# Patient Record
Sex: Female | Born: 1970 | Race: White | Hispanic: No | Marital: Married | Smoking: Former smoker
Health system: Southern US, Community
[De-identification: ages and names within clinical notes are randomized; demographics above are authoritative.]

---

## 2011-11-09 ENCOUNTER — Emergency Department (HOSPITAL_COMMUNITY)
Admission: EM | Admit: 2011-11-09 | Discharge: 2011-11-09 | Disposition: A | Discharge status: Home / Self Care | Payer: Managed Care, Other (non HMO) | Source: Home / Self Care | Attending: Family Medicine | Admitting: Family Medicine

## 2011-11-09 ENCOUNTER — Encounter (HOSPITAL_COMMUNITY): Payer: Self-pay | Admitting: *Deleted

## 2011-11-09 DIAGNOSIS — J40 Bronchitis, not specified as acute or chronic: Secondary | ICD-10-CM

## 2011-11-09 MED ORDER — ALBUTEROL SULFATE HFA 108 (90 BASE) MCG/ACT IN AERS: 1.0000 | INHALATION_SPRAY | Freq: Four times a day (QID) | RESPIRATORY_TRACT | Status: DC | PRN

## 2011-11-09 MED ORDER — AZITHROMYCIN 250 MG PO TABS: 250.0000 mg | ORAL_TABLET | Freq: Every day | ORAL | Status: AC

## 2011-11-09 MED ORDER — PHENYLEPHRINE-CHLORPHEN-DM 3.5-1-3 MG/ML PO LIQD: 1.0000 mL | Freq: Four times a day (QID) | ORAL | Status: AC | PRN

## 2011-11-09 MED ORDER — PREDNISONE 20 MG PO TABS: ORAL_TABLET | ORAL | Status: AC

## 2011-11-09 NOTE — ED Notes (Signed)
Pt  Reports  Symptoms  Of  Cough  /  Congested        X    1  Week  Has  Had  Symptoms    Of      Runny  Nose  As  Well  -  Pt  States  Has  A  Rash under l  Arm  Which  Has  Been  There        For a  Few  Days           Pt      Is  Sitting  Upright  On exam table      Speaking in  Complete  sentances

## 2011-11-09 NOTE — ED Provider Notes (Signed)
History     CSN: [id number]  Arrival date & time 11/09/11  1706   First MD Initiated Contact with Patient 11/09/11 1714      Chief Complaint  Patient presents with  . Cough    (Consider location/radiation/quality/duration/timing/severity/associated sxs/prior treatment) HPI Comments: 41 year old smoker female otherwise healthy. Here complaining of runny nose, persistent cough with productive yellow sputum for the last week. Symptoms worse at nighttime sometimes associated with wheezing. Denies fever, chills or chest pain. Appetite is good at baseline. Energy level at baseline. No abdominal pain or headache.    History reviewed. No pertinent past medical history.  History reviewed. No pertinent past surgical history.  No family history on file.  History  Substance Use Topics  . Smoking status: Current Everyday Smoker  . Smokeless tobacco: Not on file  . Alcohol Use: Yes    OB History    Grav Para Term Preterm Abortions TAB SAB Ect Mult Living                  Review of Systems  Constitutional: Negative for fever, chills and appetite change.  HENT: Positive for congestion and rhinorrhea. Negative for sore throat and neck pain.   Eyes: Negative for discharge.  Respiratory: Positive for cough, shortness of breath and wheezing.   Cardiovascular: Negative for chest pain, palpitations and leg swelling.  Gastrointestinal: Negative for nausea, vomiting, abdominal pain and diarrhea.  Musculoskeletal: Negative for myalgias and arthralgias.  Skin: Negative for rash.  Neurological: Negative for dizziness and headaches.    Allergies  Sulfa antibiotics  Home Medications   Current Outpatient Rx  Name Route Sig Dispense Refill  . XANAX PO Oral Take by mouth.    Marland Kitchen NADOLOL PO Oral Take by mouth.    . ALBUTEROL SULFATE HFA 108 (90 BASE) MCG/ACT IN AERS Inhalation Inhale 1-2 puffs into the lungs every 6 (six) hours as needed for wheezing. 1 Inhaler 0  . AZITHROMYCIN 250 MG PO TABS  Oral Take 1 tablet (250 mg total) by mouth daily. 6 tablet 0  . PHENYLEPHRINE-CHLORPHEN-DM 3.5-1-3 MG/ML PO LIQD Oral Take 1 mL by mouth every 6 (six) hours as needed for cough. 120 mL 0  . PREDNISONE 20 MG PO TABS  2 tabs po daily for 5 days 10 tablet 0    BP 117/66  Pulse 90  Temp 97.8 F (36.6 C) (Oral)  Resp 16  SpO2 100%  LMP 10/25/2011  Physical Exam  Nursing note and vitals reviewed. Constitutional: She is oriented to person, place, and time. She appears well-developed and well-nourished. No distress.  HENT:  Head: Normocephalic and atraumatic.  Right Ear: External ear normal.  Left Ear: External ear normal.  Nose: Nose normal.  Mouth/Throat: Oropharynx is clear and moist. No oropharyngeal exudate.  Eyes: Conjunctivae and EOM are normal. Pupils are equal, round, and reactive to light. Right eye exhibits no discharge. Left eye exhibits no discharge.  Neck: Neck supple. No JVD present. No thyromegaly present.  Cardiovascular: Normal rate, regular rhythm and normal heart sounds.  Exam reveals no gallop and no friction rub.   No murmur heard. Pulmonary/Chest: Effort normal.       Bronchitic cough. No tachypnea or orthopnea. Scattered rhonchi bilaterally. No active wheezing.  Lymphadenopathy:    She has no cervical adenopathy.  Neurological: She is alert and oriented to person, place, and time.  Skin: No rash noted.    ED Course  Procedures (including critical care time)  Labs Reviewed - No  data to display No results found.   1. Bronchitis       MDM  Encouraged to quit smoking. Treated with prednisone, albuterol, azithromycin and chlorpheniramine phenylephrine and dextromethorphan elixir. Asked to return if new onset of fever or worsening symptoms despite following treatment.        Sharin Grave, MD 11/10/11 [phone]

## 2017-11-20 ENCOUNTER — Encounter (HOSPITAL_COMMUNITY): Payer: Self-pay | Admitting: Emergency Medicine

## 2017-11-20 ENCOUNTER — Emergency Department (HOSPITAL_COMMUNITY)
Admission: EM | Admit: 2017-11-20 | Discharge: 2017-11-20 | Disposition: A | Discharge status: Home / Self Care | Payer: Self-pay | Attending: Emergency Medicine | Admitting: Emergency Medicine

## 2017-11-20 ENCOUNTER — Emergency Department (HOSPITAL_COMMUNITY): Payer: Self-pay

## 2017-11-20 DIAGNOSIS — Z79899 Other long term (current) drug therapy: Secondary | ICD-10-CM | POA: Insufficient documentation

## 2017-11-20 DIAGNOSIS — Z87891 Personal history of nicotine dependence: Secondary | ICD-10-CM | POA: Insufficient documentation

## 2017-11-20 DIAGNOSIS — R05 Cough: Secondary | ICD-10-CM | POA: Insufficient documentation

## 2017-11-20 DIAGNOSIS — R059 Cough, unspecified: Secondary | ICD-10-CM

## 2017-11-20 LAB — CBC WITH DIFFERENTIAL/PLATELET
ABS IMMATURE GRANULOCYTES: 0 10*3/uL (ref 0.0–0.1)
BASOS PCT: 1 %
Basophils Absolute: 0.1 10*3/uL (ref 0.0–0.1)
EOS PCT: 2 %
Eosinophils Absolute: 0.1 10*3/uL (ref 0.0–0.7)
HCT: 44.1 % (ref 36.0–46.0)
HEMOGLOBIN: 14.3 g/dL (ref 12.0–15.0)
Immature Granulocytes: 0 %
Lymphocytes Relative: 30 %
Lymphs Abs: 2.1 10*3/uL (ref 0.7–4.0)
MCH: 31 pg (ref 26.0–34.0)
MCHC: 32.4 g/dL (ref 30.0–36.0)
MCV: 95.5 fL (ref 78.0–100.0)
MONO ABS: 0.6 10*3/uL (ref 0.1–1.0)
MONOS PCT: 9 %
NEUTROS ABS: 4 10*3/uL (ref 1.7–7.7)
Neutrophils Relative %: 58 %
PLATELETS: 399 10*3/uL (ref 150–400)
RBC: 4.62 MIL/uL (ref 3.87–5.11)
RDW: 13.5 % (ref 11.5–15.5)
WBC: 6.9 10*3/uL (ref 4.0–10.5)

## 2017-11-20 LAB — BASIC METABOLIC PANEL
ANION GAP: 14 (ref 5–15)
BUN: 6 mg/dL (ref 6–20)
CALCIUM: 9.6 mg/dL (ref 8.9–10.3)
CO2: 23 mmol/L (ref 22–32)
Chloride: 103 mmol/L (ref 98–111)
Creatinine, Ser: 0.79 mg/dL (ref 0.44–1.00)
GFR calc Af Amer: >60 mL/min (ref >60)
Glucose, Bld: 111 mg/dL — ABNORMAL HIGH (ref 70–99)
Potassium: 4 mmol/L (ref 3.5–5.1)
Sodium: 140 mmol/L (ref 135–145)

## 2017-11-20 LAB — I-STAT BETA HCG BLOOD, ED (MC, WL, AP ONLY): I-stat hCG, quantitative: <5 m[IU]/mL (ref <5)

## 2017-11-20 MED ORDER — AZITHROMYCIN 250 MG PO TABS: 250.0000 mg | ORAL_TABLET | Freq: Every day | ORAL | 0 refills | Status: AC

## 2017-11-20 MED ORDER — ALBUTEROL SULFATE HFA 108 (90 BASE) MCG/ACT IN AERS
2.0000 | INHALATION_SPRAY | Freq: Once | RESPIRATORY_TRACT | Status: AC
  Administered 2017-11-20: 2 via RESPIRATORY_TRACT
  Filled 2017-11-20: qty 6.7

## 2017-11-20 NOTE — ED Triage Notes (Signed)
Pt presents to ED for assessment of cough since Labor Day, and then today states that she felt like her throat was closing up, with a lump on the left side of her throat.  Able to pass saliva and water, but patient states she feels like its getting worse.  Pt admitted a few weeks ago for Pleurisy.  Patient also states "I am having an panic attack"

## 2017-11-20 NOTE — ED Provider Notes (Signed)
[facility] EMERGENCY DEPARTMENT Provider Note   CSN: [id number] Arrival date & time: 11/20/17  1345     History   Chief Complaint Chief Complaint  Patient presents with  . Cough    HPI Jeanne Ferguson is a 47 y.o. female.  Patient with history of hypertension currently on lisinopril presents the emergency department today with cough for the past several weeks.  Cough is productive of clear sputum.  No fevers.  She states that at times she will have some wheezing.  She also has a persistent runny nose.  No chest pain or shortness of breath.  Patient reports quitting smoking around the time that the cough began.  She also went back on her lisinopril just before the cough started.  She went to an urgent care and was given a medicine for cough, no antibiotics.  She came to the emergency department today because she had an episode where it felt like her throat was swelling and she was having difficulty taking a deep breath and swallowing.  She states that it feels like there is something crawling up her throat.  No nausea, vomiting, diarrhea, lightheadedness, syncope, lip swelling.     History reviewed. No pertinent past medical history.  There are no active problems to display for this patient.   History reviewed. No pertinent surgical history.   OB History   None      Home Medications    Prior to Admission medications   Medication Sig Start Date End Date Taking? Authorizing Provider  ALPRAZolam (XANAX PO) Take by mouth.    [provider]  azithromycin (ZITHROMAX) 250 MG tablet Take 1 tablet (250 mg total) by mouth daily. Take first 2 tablets together, then 1 every day until finished. 11/20/17   Renne Crigler, PA-C    Family History History reviewed. No pertinent family history.  Social History Social History   Tobacco Use  . Smoking status: Former Smoker    Last attempt to quit: 11/05/2017    Years since quitting: 0.0  . Smokeless tobacco:  Never Used  Substance Use Topics  . Alcohol use: Yes  . Drug use: Never     Allergies   Sulfa antibiotics   Review of Systems Review of Systems  Constitutional: Negative for chills, fatigue and fever.  HENT: Positive for congestion and trouble swallowing. Negative for ear pain, sinus pressure and sore throat.   Eyes: Negative for redness.  Respiratory: Positive for cough and wheezing.   Gastrointestinal: Negative for abdominal pain, diarrhea, nausea and vomiting.  Genitourinary: Negative for dysuria.  Musculoskeletal: Negative for myalgias and neck stiffness.  Skin: Negative for rash.  Neurological: Negative for headaches.  Hematological: Negative for adenopathy.     Physical Exam Updated Vital Signs BP (!) 145/99 (BP Location: Right Arm)   Pulse 90   Temp 98.5 F (36.9 C) (Oral)   Resp 18   SpO2 100%   Physical Exam  Constitutional: She appears well-developed and well-nourished.  HENT:  Head: Normocephalic and atraumatic.  Right Ear: Tympanic membrane, external ear and ear canal normal.  Left Ear: Tympanic membrane, external ear and ear canal normal.  Nose: No mucosal edema or rhinorrhea.  Mouth/Throat: Oropharynx is clear and moist.  Eyes: Conjunctivae are normal. Right eye exhibits no discharge. Left eye exhibits no discharge.  Neck: Normal range of motion. Neck supple.  Cardiovascular: Normal rate, regular rhythm and normal heart sounds.  No murmur heard. Pulmonary/Chest: Effort normal and breath sounds normal.  No stridor. No respiratory distress. She has no wheezes. She has no rales.  Abdominal: Soft. There is no tenderness. There is no rebound and no guarding.  Neurological: She is alert.  Skin: Skin is warm and dry.  Psychiatric: She has a normal mood and affect.  Nursing note and vitals reviewed.    ED Treatments / Results  Labs (all labs ordered are listed, but only abnormal results are displayed) Labs Reviewed  BASIC METABOLIC PANEL - Abnormal;  Notable for the following components:      Result Value   Glucose, Bld 111 (*)    All other components within normal limits  CBC WITH DIFFERENTIAL/PLATELET  I-STAT BETA HCG BLOOD, ED ([facility], [facility], [facility] ONLY)    EKG None  Radiology Dg Neck Soft Tissue  Result Date: 11/20/2017 CLINICAL DATA:  Cough. EXAM: NECK SOFT TISSUES - 1+ VIEW COMPARISON:  No prior. FINDINGS: Loss of normal cervical lordosis. Diffuse mild degenerative change. Subglottic airway narrowing is noted. Croup could present in this fashion. Epiglottis is normal. Retropharyngeal space is normal. IMPRESSION: Subglottic airway narrowing is noted. Croup could present in this fashion. Epiglottis is normal. Retropharyngeal space is normal. Electronically Signed   By: Maisie Fus  Register   On: 11/20/2017 15:21   Dg Chest 2 View  Result Date: 11/20/2017 CLINICAL DATA:  Cough. EXAM: CHEST - 2 VIEW COMPARISON:  No prior. FINDINGS: Mediastinum hilar structures normal. Low lung volumes. Mild left base infiltrate excluded. No pleural effusion or pneumothorax. Heart size normal. Thoracic spine scoliosis and degenerative change. IMPRESSION: Low lung volumes.  Mild left base infiltrate cannot be excluded. Electronically Signed   By: Maisie Fus  Register   On: 11/20/2017 15:19    Procedures Procedures (including critical care time)  Medications Ordered in ED Medications  albuterol (PROVENTIL HFA;VENTOLIN HFA) 108 (90 Base) MCG/ACT inhaler 2 puff (has no administration in time range)     Initial Impression / Assessment and Plan / ED Course  I have reviewed the triage vital signs and the nursing notes.  Pertinent labs & imaging results that were available during my care of the patient were reviewed by me and considered in my medical decision making (see chart for details).     Patient seen and examined.  X-ray results reviewed with patient.  There is a mild left basilar infiltrate noted.  Will cover for pneumonia and give albuterol inhaler for  home.  Vital signs reviewed and are as follows: BP (!) 145/99 (BP Location: Right Arm)   Pulse 90   Temp 98.5 F (36.9 C) (Oral)   Resp 18   SpO2 100%   If patient symptoms are not improved after completion of this, she will follow-up with her doctor to address other possible causes of chronic cough including use of lisinopril, postnasal drip, GERD.  She verbalizes understanding agrees with plan.  Final Clinical Impressions(s) / ED Diagnoses   Final diagnoses:  Cough   Patient with ongoing cough.  Chest x-ray cannot rule out pneumonia so patient will be treated for this.  Neck x-ray shows mild steeple sign, but overall symptoms are not suggestive of a viral infection.  No airway compromise or difficulty breathing at time of exam.  We will treat for pneumonia to take this off the list of possibilities, however patient realizes that she will need to follow-up if her symptoms are persistent.   ED Discharge Orders         Ordered    azithromycin (ZITHROMAX) 250 MG tablet  Daily  11/20/17 1956           Renne CriglerGeiple, Kelie Gainey, PA-C 11/20/17 2002    Margarita Grizzleay, Danielle, MD 11/22/17 1025

## 2017-11-20 NOTE — Discharge Instructions (Signed)
. °  Please read and follow all provided instructions.  Your diagnoses today include:  1. Cough    Tests performed today include:  Chest x-ray - suggests possible pneumonia at left lower lobe  Vital signs. See below for your results today.   Medications prescribed:   Azithromycin - antibiotic for respiratory infection  You have been prescribed an antibiotic medicine: take the entire course of medicine even if you are feeling better. Stopping early can cause the antibiotic not to work.   Albuterol inhaler - medication that opens up your airway  Use inhaler as follows: 1-2 puffs with spacer every 4 hours as needed for wheezing, cough, or shortness of breath.   Take any prescribed medications only as directed.  Home care instructions:  Follow any educational materials contained in this packet.  Follow-up instructions: Please follow-up with your primary care provider in the next 7 days for further evaluation of your symptoms and a recheck if you are not feeling better.   Return instructions:   Please return to the Emergency Department if you experience worsening symptoms.  Please return with worsening wheezing, shortness of breath, or difficulty breathing.  Return with persistent fever above 101F.   Please return if you have any other emergent concerns.  Additional Information:  Your vital signs today were: BP (!) 145/99 (BP Location: Right Arm)    Pulse 90    Temp 98.5 F (36.9 C) (Oral)    Resp 18    SpO2 100%  If your blood pressure (BP) was elevated above 135/85 this visit, please have this repeated by your doctor within one month. --------------

## 2017-11-20 NOTE — ED Provider Notes (Signed)
MSE was initiated and I personally evaluated the patient and placed orders (if any) at  2:04 PM on November 20, 2017.  The patient appears stable so that the remainder of the MSE may be completed by another provider.  Patient placed in Quick Look pathway, seen and evaluated   Chief Complaint: cough  HPI:  47yo F presents for cough since Labor Day unrelieved with OTC antitussives. Cough is productive with clear mucus. Unsure if this is related to her quitting tobacco use on Labor Day. Also reports increase in anxiety today, causing her to have SOB. States that she also feels like because of her cough, she feels like there is a lump on the L side of her throat. Reports pain with swallowing but denies drooling or neck pain. Denies chest pain, hemoptysis, prior MI or PE.  ROS: cough  Physical Exam:   Gen: No distress  Neuro: Awake and Alert  Skin: Warm    Focused Exam: tachycardic to 120s. Appears anxious. Lungs CTAB. No tonsillar enlargement or signs of PTA/RPA noted on exam. Tolerating secretions without difficulty.   Initiation of care has begun. The patient has been counseled on the process, plan, and necessity for staying for the completion/evaluation, and the remainder of the medical screening examination    Dietrich PatesKhatri, Dhanvi Boesen, PA-C 11/20/17 1407    Pricilla LovelessGoldston, Scott, MD 11/20/17 [phone]

## 2019-05-04 IMAGING — DX DG CHEST 2V
2 series · 2 of 2 positions shown · non-contrast
Comparison: No prior.

CLINICAL DATA: Cough.

EXAM:
CHEST - 2 VIEW

[w chest pa]
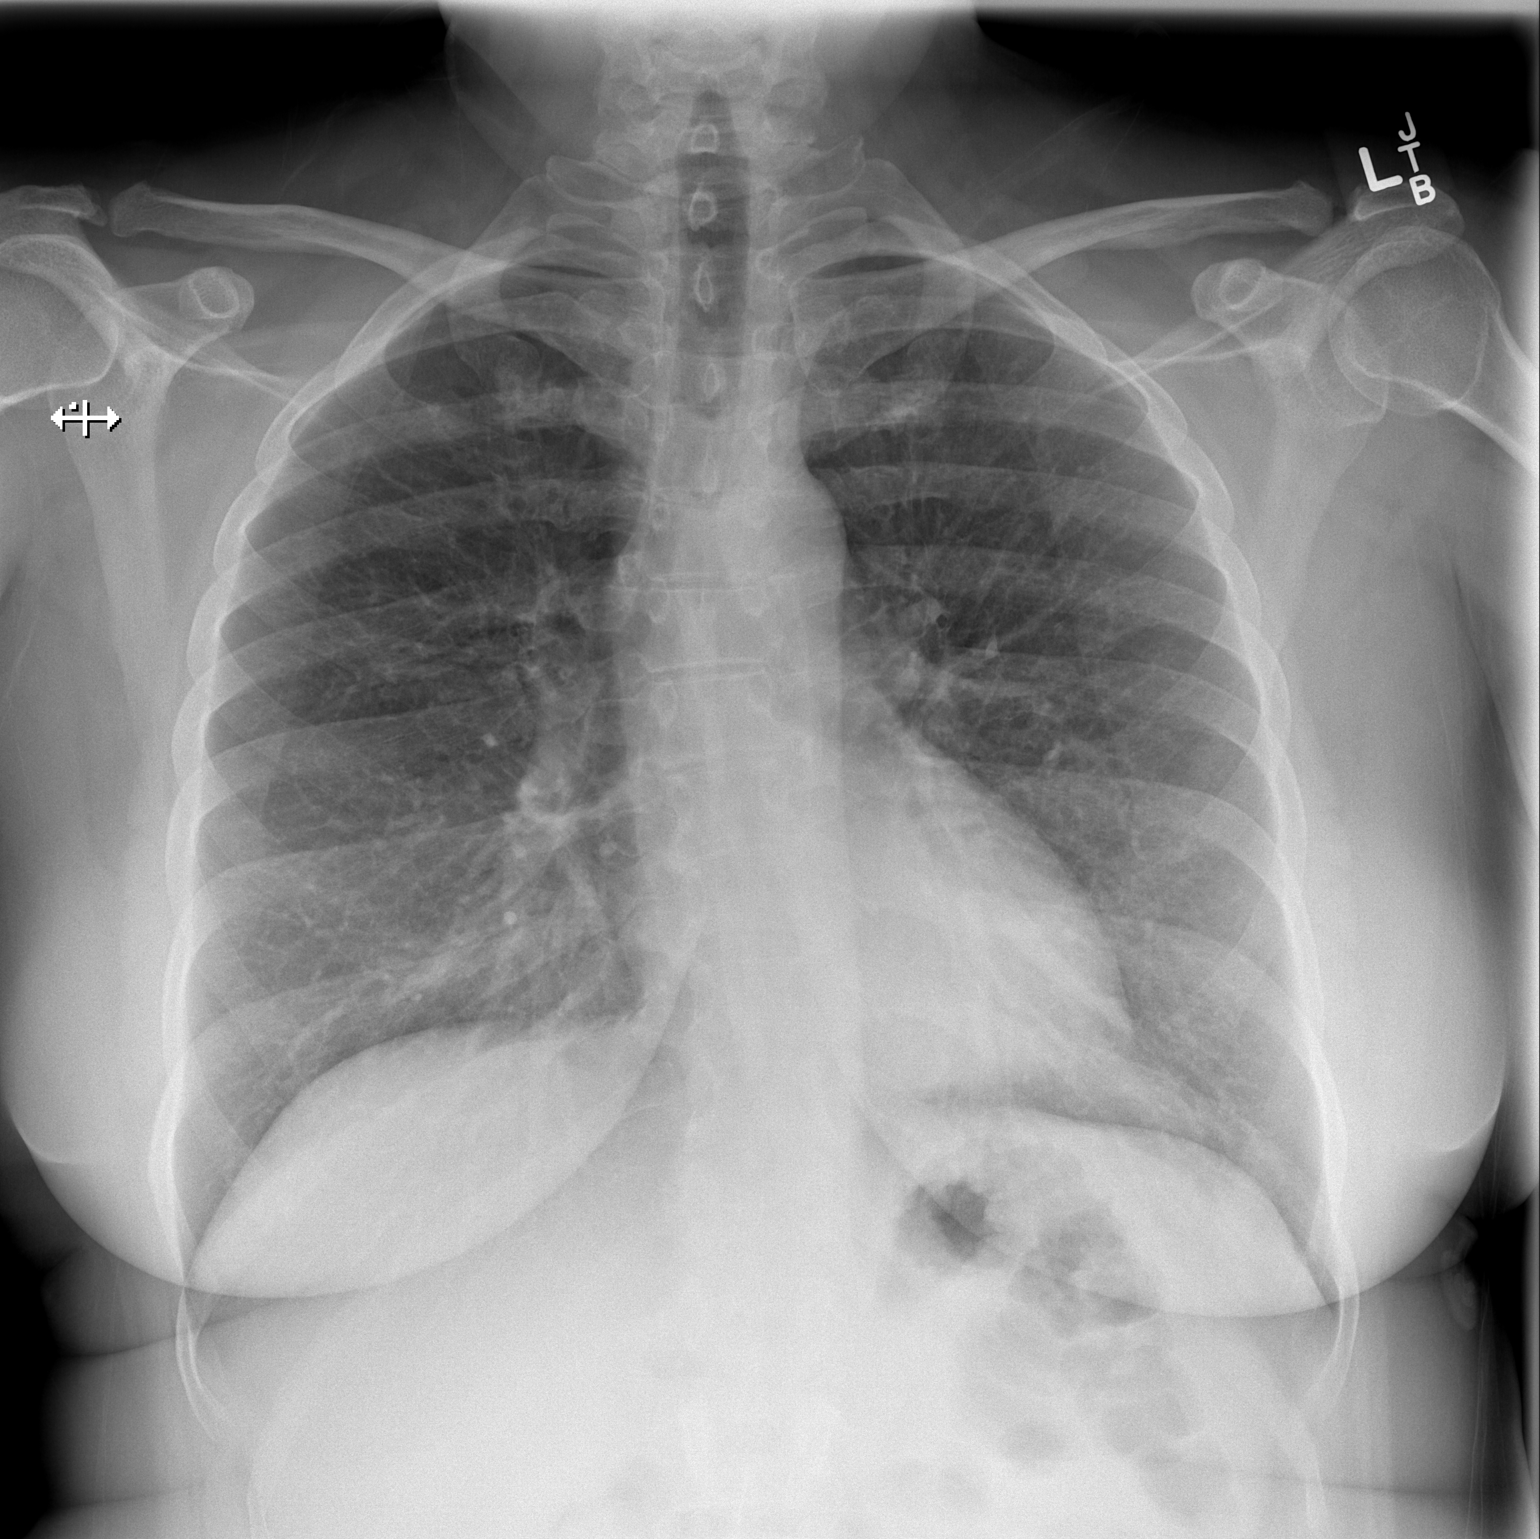

[w chest lat]
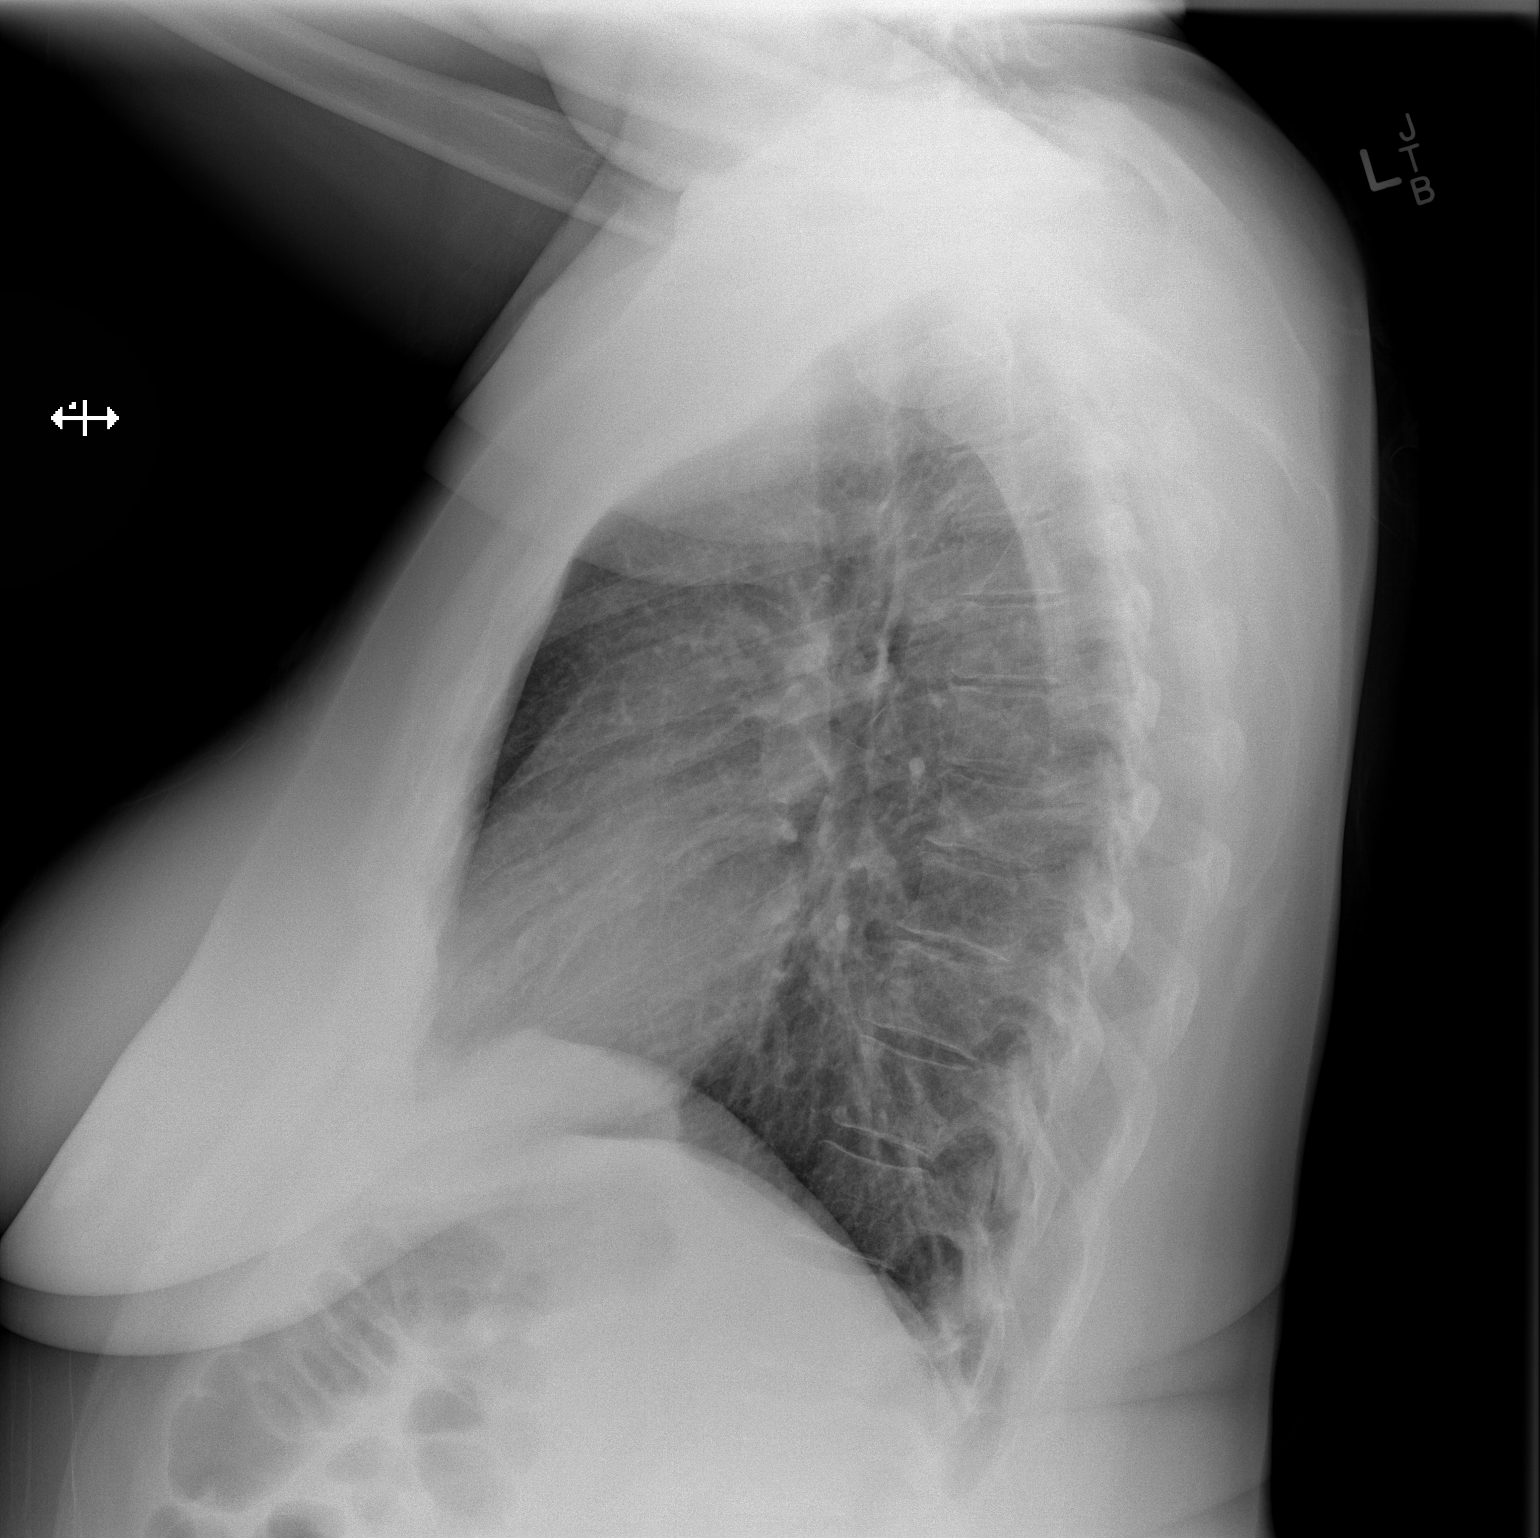

[2 of 2 positions shown; findings below may reference images not displayed]

FINDINGS: Mediastinum hilar structures normal. Low lung volumes. Mild left
base infiltrate excluded. No pleural effusion or pneumothorax. Heart
size normal. Thoracic spine scoliosis and degenerative change.
IMPRESSION: Low lung volumes.  Mild left base infiltrate cannot be excluded.
